# Patient Record
Sex: Male | Born: 1980 | Race: White | Hispanic: No | Marital: Married | Smoking: Never smoker
Health system: Southern US, Community
[De-identification: ages and names within clinical notes are randomized; demographics above are authoritative.]

## PROBLEM LIST (undated history)

## (undated) DIAGNOSIS — I10 Essential (primary) hypertension: Secondary | ICD-10-CM

## (undated) DIAGNOSIS — F4310 Post-traumatic stress disorder, unspecified: Secondary | ICD-10-CM

## (undated) DIAGNOSIS — E785 Hyperlipidemia, unspecified: Secondary | ICD-10-CM

---

## 2003-09-14 ENCOUNTER — Emergency Department (HOSPITAL_COMMUNITY): Admission: EM | Admit: 2003-09-14 | Discharge: 2003-09-15 | Payer: Self-pay | Admitting: Emergency Medicine

## 2003-09-17 ENCOUNTER — Emergency Department (HOSPITAL_COMMUNITY): Admission: EM | Admit: 2003-09-17 | Discharge: 2003-09-17 | Payer: Self-pay | Admitting: Emergency Medicine

## 2013-10-26 ENCOUNTER — Emergency Department (HOSPITAL_BASED_OUTPATIENT_CLINIC_OR_DEPARTMENT_OTHER)
Admission: EM | Admit: 2013-10-26 | Discharge: 2013-10-27 | Disposition: A | Discharge status: Home / Self Care | Payer: Non-veteran care | Attending: Emergency Medicine | Admitting: Emergency Medicine

## 2013-10-26 ENCOUNTER — Encounter (HOSPITAL_BASED_OUTPATIENT_CLINIC_OR_DEPARTMENT_OTHER): Payer: Self-pay | Admitting: Emergency Medicine

## 2013-10-26 DIAGNOSIS — Z79899 Other long term (current) drug therapy: Secondary | ICD-10-CM | POA: Insufficient documentation

## 2013-10-26 DIAGNOSIS — F411 Generalized anxiety disorder: Secondary | ICD-10-CM | POA: Insufficient documentation

## 2013-10-26 DIAGNOSIS — Z8639 Personal history of other endocrine, nutritional and metabolic disease: Secondary | ICD-10-CM | POA: Insufficient documentation

## 2013-10-26 DIAGNOSIS — H9209 Otalgia, unspecified ear: Secondary | ICD-10-CM | POA: Insufficient documentation

## 2013-10-26 DIAGNOSIS — T498X5A Adverse effect of other topical agents, initial encounter: Secondary | ICD-10-CM | POA: Insufficient documentation

## 2013-10-26 DIAGNOSIS — R221 Localized swelling, mass and lump, neck: Secondary | ICD-10-CM

## 2013-10-26 DIAGNOSIS — H5710 Ocular pain, unspecified eye: Secondary | ICD-10-CM | POA: Insufficient documentation

## 2013-10-26 DIAGNOSIS — R209 Unspecified disturbances of skin sensation: Secondary | ICD-10-CM | POA: Insufficient documentation

## 2013-10-26 DIAGNOSIS — R220 Localized swelling, mass and lump, head: Secondary | ICD-10-CM | POA: Insufficient documentation

## 2013-10-26 DIAGNOSIS — T50905A Adverse effect of unspecified drugs, medicaments and biological substances, initial encounter: Secondary | ICD-10-CM

## 2013-10-26 DIAGNOSIS — I10 Essential (primary) hypertension: Secondary | ICD-10-CM | POA: Insufficient documentation

## 2013-10-26 DIAGNOSIS — Z862 Personal history of diseases of the blood and blood-forming organs and certain disorders involving the immune mechanism: Secondary | ICD-10-CM | POA: Insufficient documentation

## 2013-10-26 DIAGNOSIS — R61 Generalized hyperhidrosis: Secondary | ICD-10-CM | POA: Insufficient documentation

## 2013-10-26 DIAGNOSIS — H538 Other visual disturbances: Secondary | ICD-10-CM | POA: Insufficient documentation

## 2013-10-26 HISTORY — DX: Essential (primary) hypertension: I10

## 2013-10-26 HISTORY — DX: Hyperlipidemia, unspecified: E78.5

## 2013-10-26 HISTORY — DX: Post-traumatic stress disorder, unspecified: F43.10

## 2013-10-26 NOTE — ED Notes (Signed)
Pt reports headache onset this AM felt like lightening strike thjat has progressively worsened today. Currently report slight blurring of vision, numbness bilat hands and right side of face

## 2013-10-26 NOTE — ED Provider Notes (Signed)
CSN: [id number]     Arrival date & time 10/26/13  2320 History  This chart was scribed for Henry Davis Smitty CordsK Joclyn Alsobrook-Rasch, MD by Phillis HaggisGabriella Gaje, ED Scribe. This patient was seen in room MH12/MH12 and patient care was started at 12:15 AM.    Chief Complaint  Patient presents with  . Headache   Patient is a 33 y.o. male presenting with headaches. The history is provided by the patient. No language interpreter was used.  Headache Location: pinna of B ears are burning. Quality: burning. Radiates to:  Does not radiate Severity currently:  4/10 Severity at highest:  4/10 Onset quality:  Gradual Duration:  1 day Timing:  Constant Progression:  Worsening Context: not coughing, not intercourse and not straining   Relieved by:  Nothing Worsened by:  Nothing tried Ineffective treatments:  Acetaminophen Associated symptoms: ear pain, eye pain and paresthesias   Associated symptoms: no abdominal pain, no congestion, no cough, no diarrhea, no dizziness, no fatigue, no fever, no focal weakness, no hearing loss, no myalgias, no nausea, no near-syncope, no neck pain, no neck stiffness, no numbness, no photophobia, no seizures, no sinus pressure, no sore throat, no swollen glands, no syncope and no vomiting   Associated symptoms comment:  Eye redness per report Ear pain:    Location:  Bilateral   Severity:  Moderate   Onset quality:  Gradual   Timing:  Constant   Progression:  Unchanged   Chronicity:  New Risk factors: no family hx of SAH   HPI Comments: Henry Davis is a 33 y.o. male with a history of HTN who presents to the Emergency Department complaining burning of B ears and facial and B hand numbness x 24 hours. He reports associated tingling on the right side of his face with changes in taste, noting that food tastes bland on his entire tongue with lip swelling. He reports burning bilateral ear pain. He also reports eye pain with blurriness but no double vision also states eyes are burning and red. He  reports bilateral hand and right face numbness and irregular sweating. He reports taking tylenol and BP pill. He denies new travel, cough, congestion, nausea, vomiting, diarrhea, constipation, abdominal pain, sore throat, or joint pain, no neck pain or stiffness. No tick or insect exposure. He states that he does not have a PCP but [facility] [place]. 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 He reports being put on a new anti-depressant, Venlafaxine 75 mg, but denies consistently taking his medication.  Past Medical History  Diagnosis Date  . Hypertension   . PTSD (post-traumatic stress disorder)   . Hyperlipidemia    History reviewed. No pertinent past surgical history. History reviewed. No pertinent family history. History  Substance Use Topics  . Smoking status: Never Smoker   . Smokeless tobacco: Never Used  . Alcohol Use: Yes    Review of Systems  Constitutional: Positive for diaphoresis. Negative for fever, chills and fatigue.  HENT: Positive for ear pain. Negative for congestion, dental problem, drooling, ear discharge, facial swelling, hearing loss, nosebleeds, sinus pressure, sore throat, trouble swallowing and voice change.   Eyes: Positive for pain and visual disturbance. Negative for photophobia.  Respiratory: Negative for cough and wheezing.   Cardiovascular: Negative for chest pain, palpitations, leg swelling, syncope and near-syncope.  Gastrointestinal: Negative for nausea, vomiting, abdominal pain and diarrhea.  Genitourinary: Negative for dysuria.  Musculoskeletal: Negative for arthralgias, myalgias, neck pain and neck stiffness.  Skin: Negative for color change and rash.  Neurological: Positive for headaches and paresthesias. Negative for dizziness, focal weakness, seizures, facial asymmetry, weakness, light-headedness and numbness.  All other systems reviewed and are negative.  Allergies  Review of patient's allergies indicates no  known allergies.  Home Medications   Prior to Admission medications   Medication Sig Start Date End Date Taking? Authorizing Provider  clonazePAM (KLONOPIN) 0.5 MG tablet Take 0.5 mg by mouth 3 (three) times daily as needed for anxiety.   Yes Historical Provider, MD  traZODone (DESYREL) 50 MG tablet Take 50 mg by mouth at bedtime.   Yes Historical Provider, MD   BP 117/91  Pulse 88  Temp(Src) 98.1 F (36.7 C) (Oral)  Resp 18  SpO2 97% Physical Exam  Constitutional: He is oriented to person, place, and time. He appears well-developed and well-nourished. No distress.  Sitting comfortably in the room with lights on  HENT:  Head: Normocephalic and atraumatic.  Left Ear: External ear normal.  Mouth/Throat: Oropharynx is clear and moist. No oropharyngeal exudate.  Redness in right canal, but TM is normal. Trachea is midline.   Eyes: Conjunctivae and EOM are normal. Pupils are equal, round, and reactive to light.  Neck: Normal range of motion. Neck supple. No thyromegaly present.  No meningismus.  Cardiovascular: Normal rate, regular rhythm and intact distal pulses.   Pulmonary/Chest: Effort normal and breath sounds normal. No respiratory distress. He has no wheezes. He has no rales. He exhibits no tenderness.  Abdominal: Soft. Bowel sounds are normal. There is no tenderness. There is no rebound and no guarding.  Musculoskeletal: Normal range of motion. He exhibits no edema and no tenderness.  Lymphadenopathy:    He has no cervical adenopathy.  Neurological: He is alert and oriented to person, place, and time. He has normal reflexes. He displays no atrophy, no tremor and normal reflexes. No cranial nerve deficit. He exhibits normal muscle tone. He displays no seizure activity. GCS eye subscore is 4. GCS verbal subscore is 5. GCS motor subscore is 6.  No rigity no clonus no hyperreflexia  Skin: Skin is warm and dry. No rash noted. No erythema. No pallor.  Psychiatric: Thought content  normal. His mood appears anxious.    ED Course  Procedures (including critical care time) DIAGNOSTIC STUDIES: Oxygen Saturation is 97% on room air, normal by my interpretation.    COORDINATION OF CARE: 12:22 AM-Discussed treatment plan which includes labs with pt at bedside and pt agreed to plan.   Labs Review Labs Reviewed  CBC WITH DIFFERENTIAL - Abnormal; Notable for the following:    WBC 11.1 (*)    All other components within normal limits  COMPREHENSIVE METABOLIC PANEL  TROPONIN I   Imaging Review Ct Head Wo Contrast  10/27/2013   CLINICAL DATA:  Headache  EXAM: CT HEAD WITHOUT CONTRAST  TECHNIQUE: Contiguous axial images were obtained from the base of the skull through the vertex without intravenous contrast.  COMPARISON:  None.  FINDINGS: There is no acute intracranial hemorrhage or infarct. No mass lesion or midline shift. Gray-white matter differentiation is well maintained. Ventricles are normal in size without evidence of hydrocephalus. CSF containing spaces are within normal limits. No extra-axial fluid collection.  The calvarium is intact.  Orbital soft tissues are within normal limits.  The paranasal sinuses and mastoid air cells are well pneumatized and free of fluid.  Scalp soft tissues are unremarkable.  IMPRESSION: Normal head CT with no acute intracranial process identified.  Electronically Signed   By: Rise Mu M.D.   On: 10/27/2013 00:26    EKG Interpretation None      MDM   Final diagnoses:  None  This was not a sudden onset maximal instensity HA.  In fact the patient did not even mention HA to the EDP mentions tingling and ears burning which started and have gotten progressively worse.  CT scan excludes SAH which was highly doubted to begin with.  No meningitis symptoms.  No travel or tick exposure.     Case d/w Dr. Thad Ranger of neurology who states symptoms do not sound like any neurologic condition nor meningitis and may be related to PTSD.  No  indication for further work up.  Recommends follow up with patient's primary doctor  Suspect anxiety component to symptoms but upon review the Effexor which was recently started lists all the patient's symptoms as a side effect.  Recommend discussing this medication with your doctor.  Symptoms improved in the ED.  Return for focal weakness changes in speech headache, fever rashes stiff neck or any concerns.    I personally performed the services described in this documentation, which was scribed in my presence. The recorded information has been reviewed and is accurate.      Jasmine Awe, MD 10/27/13 [phone]

## 2013-10-27 ENCOUNTER — Encounter (HOSPITAL_BASED_OUTPATIENT_CLINIC_OR_DEPARTMENT_OTHER): Payer: Self-pay | Admitting: Emergency Medicine

## 2013-10-27 ENCOUNTER — Emergency Department (HOSPITAL_BASED_OUTPATIENT_CLINIC_OR_DEPARTMENT_OTHER): Payer: Non-veteran care

## 2013-10-27 LAB — URINE MICROSCOPIC-ADD ON

## 2013-10-27 LAB — COMPREHENSIVE METABOLIC PANEL
ALT: 86 U/L — AB (ref 0–53)
AST: 37 U/L (ref 0–37)
Albumin: 4.1 g/dL (ref 3.5–5.2)
Alkaline Phosphatase: 63 U/L (ref 39–117)
Anion gap: 14 (ref 5–15)
BUN: 13 mg/dL (ref 6–23)
CALCIUM: 9.8 mg/dL (ref 8.4–10.5)
CO2: 22 mEq/L (ref 19–32)
Chloride: 106 mEq/L (ref 96–112)
Creatinine, Ser: 1 mg/dL (ref 0.50–1.35)
GFR calc Af Amer: >90 mL/min (ref >90)
GFR calc non Af Amer: >90 mL/min (ref >90)
Glucose, Bld: 143 mg/dL — ABNORMAL HIGH (ref 70–99)
POTASSIUM: 4.3 meq/L (ref 3.7–5.3)
Sodium: 142 mEq/L (ref 137–147)
TOTAL PROTEIN: 7.3 g/dL (ref 6.0–8.3)
Total Bilirubin: 0.2 mg/dL — ABNORMAL LOW (ref 0.3–1.2)

## 2013-10-27 LAB — CBC WITH DIFFERENTIAL/PLATELET
BASOS ABS: 0.1 10*3/uL (ref 0.0–0.1)
Basophils Relative: 1 % (ref 0–1)
EOS PCT: 3 % (ref 0–5)
Eosinophils Absolute: 0.3 10*3/uL (ref 0.0–0.7)
HCT: 45.6 % (ref 39.0–52.0)
Hemoglobin: 15.7 g/dL (ref 13.0–17.0)
Lymphocytes Relative: 33 % (ref 12–46)
Lymphs Abs: 3.6 10*3/uL (ref 0.7–4.0)
MCH: 30.7 pg (ref 26.0–34.0)
MCHC: 34.4 g/dL (ref 30.0–36.0)
MCV: 89.2 fL (ref 78.0–100.0)
Monocytes Absolute: 0.8 10*3/uL (ref 0.1–1.0)
Monocytes Relative: 7 % (ref 3–12)
Neutro Abs: 6.3 10*3/uL (ref 1.7–7.7)
Neutrophils Relative %: 57 % (ref 43–77)
Platelets: 248 10*3/uL (ref 150–400)
RBC: 5.11 MIL/uL (ref 4.22–5.81)
RDW: 13.3 % (ref 11.5–15.5)
WBC: 11.1 10*3/uL — ABNORMAL HIGH (ref 4.0–10.5)

## 2013-10-27 LAB — RAPID URINE DRUG SCREEN, HOSP PERFORMED
Amphetamines: NOT DETECTED
Barbiturates: NOT DETECTED
Benzodiazepines: NOT DETECTED
COCAINE: NOT DETECTED
OPIATES: NOT DETECTED
TETRAHYDROCANNABINOL: NOT DETECTED

## 2013-10-27 LAB — URINALYSIS, ROUTINE W REFLEX MICROSCOPIC
BILIRUBIN URINE: NEGATIVE
Glucose, UA: NEGATIVE mg/dL
HGB URINE DIPSTICK: NEGATIVE
Ketones, ur: NEGATIVE mg/dL
Leukocytes, UA: NEGATIVE
Nitrite: NEGATIVE
PROTEIN: NEGATIVE mg/dL
Specific Gravity, Urine: 1.021 (ref 1.005–1.030)
UROBILINOGEN UA: 1 mg/dL (ref 0.0–1.0)
pH: 8 (ref 5.0–8.0)

## 2013-10-27 LAB — TROPONIN I: Troponin I: <0.3 ng/mL (ref <0.30)

## 2013-10-27 MED ORDER — IBUPROFEN 800 MG PO TABS: 800.0000 mg | ORAL_TABLET | Freq: Three times a day (TID) | ORAL | Status: AC

## 2013-10-27 MED ORDER — KETOROLAC TROMETHAMINE 30 MG/ML IJ SOLN
30.0000 mg | Freq: Once | INTRAMUSCULAR | Status: AC
  Administered 2013-10-27: 30 mg via INTRAVENOUS
  Filled 2013-10-27: qty 1

## 2013-10-27 NOTE — ED Notes (Signed)
Pt ambulating independently w/ steady gait on d/c in no acute distress, A&Ox4. D/c instructions reviewed w/ pt - pt denies any further questions or concerns at present. Rx given x1  

## 2013-10-27 NOTE — ED Notes (Signed)
Patient stated he felt like he was burning up hot. I gave patient a fan. Comfort improved.

## 2013-10-27 NOTE — ED Notes (Signed)
Pt reports posterior HA that began today, diaphoresis and intermittent facial numbness - pt in no acute distress at present - pleasant and cooperative. No focal weakness noted on assessment.

## 2013-10-27 NOTE — Discharge Instructions (Signed)
°Emergency Department Resource Guide °1) Find a Doctor and Pay Out of Pocket °Although you won't have to find out who is covered by your insurance plan, it is a good idea to ask around and get recommendations. You will then need to call the office and see if the doctor you have chosen will accept you as a new patient and what types of options they offer for patients who are self-pay. Some doctors offer discounts or will set up payment plans for their patients who do not have insurance, but you will need to ask so you aren't surprised when you get to your appointment. ° °2) Contact Your Local Health Department °Not all health departments have doctors that can see patients for sick visits, but many do, so it is worth a call to see if yours does. If you don't know where your local health department is, you can check in your phone book. The CDC also has a tool to help you locate your state's health department, and many state websites also have listings of all of their local health departments. ° °3) Find a Walk-in Clinic °If your illness is not likely to be very severe or complicated, you may want to try a walk in clinic. These are popping up all over the country in pharmacies, drugstores, and shopping centers. They're usually staffed by nurse practitioners or physician assistants that have been trained to treat common illnesses and complaints. They're usually fairly quick and inexpensive. However, if you have serious medical issues or chronic medical problems, these are probably not your best option. ° °No Primary Care Doctor: °- Call [facility] at  832-8000 - they can help you locate a primary care doctor that  accepts your insurance, provides certain services, etc. °- Physician Referral Service- 1-800-533-3463 ° °Chronic Pain Problems: °Organization         Address  Phone   Notes  °[facility]  (336) 297-2271 Patients need to be referred by their primary care doctor.  ° °Medication  Assistance: °Organization         Address  Phone   Notes  °[place] [facility] [address] °[place], [place] [address] (336) 641-8030 --Must be a resident of [place] °-- Must have NO insurance coverage whatsoever (no Medicaid/ Medicare, etc.) °-- The pt. MUST have a primary care doctor that directs their care regularly and follows them in the community °  °[facility]  (866) 331-1348   °[employer]  (888) 892-1162   ° °Agencies that provide inexpensive medical care: °Organization         Address  Phone   Notes  °[facility]  (336) 832-8035   °[facility]    (336) 832-7272   °[facility] [address] °[place], [place] [address] (336) 832-4777   °[facility] [address], °[place] (336) 271-4999   °[facility]    (336) 373-0678   °[facility]    (336) 272-1050   °[facility] ° [address], [place] Phone:  (336) 832-4444, Fax:  (336) 832-4440 Hours of Operation:  9 am - 6 pm, M-F.  Also accepts Medicaid/Medicare and self-pay.  °[facility] ° [address], [place] Phone: (336) 832-3150, Fax: (336) 832-3151. Hours of Operation:  8:30 am - 5:30 pm, M-F.  Also accepts Medicaid and self-pay.  °[facility] [place] [address], [place] Phone: (336) 878-6027   °[facility] [address], [place], [place] (336)723-1848, Ext. 123 Mondays & Thursdays: 7-9 AM.  First 15 patients are seen on a first come, first serve basis. °  ° °Medicaid-accepting [place] Providers: ° °Organization         Address  Phone   Notes  °[facility] [address], [place] (336) 641-2100 Also accepts self-pay patients.  °[facility] [address], [place] ° (336) 856-9996   °[facility] [address] [place] [address], [place]  (336) 288-8857   °[facility] [address] [place] [address], [place] (336) 299-7000   °Veita Bland [address], [place]  ° (336) 373-1557 Only accepts [place] Access Medicaid patients after they have their name applied to their card.  ° °Self-Pay (no insurance) in [place]: ° °Organization         Address  Phone   Notes  °Sickle Cell Patients, [facility] [address], [place] (336) 832-1970   °[facility] [address], [place] (336) 832-4400   °[facility] ° [address], [place] (336) 992-4800   °[facility]/Dr. Osei-Bonsu ° [address] [place] [address], [place] or [address], [place] (336) [phone] Phone number for both [place] and [place] locations is the same.  °[facility] [address], [place] (336) 299-0000   °[facility] [address] [place] [address], [place] or [address] (336) 852-7530 °(336) 878-2260   °[facility] [address], [place] (336) 350-1642, phone; (336) 294-5005, fax Sees patients 1st and 3rd Saturday of every month.  Must not qualify for public or private insurance (i.e. Medicaid, Medicare, [place] Health Choice, Veterans' Benefits) • Household income should be no more than 200% of the poverty level •The clinic cannot treat you if you are pregnant or think you are pregnant • Sexually transmitted diseases are not treated at the clinic.  ° ° °Dental Care: °Organization         Address  Phone  Notes  °[place] [facility] [address], [place] (336) 641-6152 Accepts children up to age 21 who are enrolled in Medicaid or [place] Health Choice; pregnant women with a Medicaid card; and children who have applied for Medicaid or [place] Health Choice, but were declined, whose parents can pay a reduced fee at time of service.  °[place]  [facility] [place]  [address], [place] (336) 641-7733 Accepts children up to age 21 who are enrolled in Medicaid or [place] Health Choice; pregnant women with a Medicaid card; and children who have applied for Medicaid or [place] Health Choice, but were declined, whose parents can pay a reduced fee at time of service.  °[facility] ° [address],  (336) 641-4533 Patients are seen by appointment only. Walk-ins are not accepted. [facility] will see patients 18 years of age and older. °Monday - Tuesday (8am-5pm) °Most Wednesdays (8:30-5pm) °$30 per visit, cash only  °[facility] ° [address], [place] (336) 641-4533 Patients are seen by appointment only. Walk-ins are not accepted. [facility] will see patients 18 years of age and older. °One   Wednesday Evening (Monthly: Volunteer Based).  $30 per visit, cash only  °[facility]  (919) 537-3737 for adults; Children under age 4, call Graduate Pediatric Dentistry at ([phone]) [phone]. Children aged 4-14, please call ([phone]) [phone] to request a pediatric application. ° Dental services are provided in all areas of dental care including fillings, crowns and bridges, complete and partial dentures, implants, gum treatment, root canals, and extractions. Preventive care is also provided. Treatment is provided to both adults and children. °Patients are selected via a lottery and there is often a waiting list. °  °[facility] [address], °[place] ° (336) 763-8833 [email or web address] °  °[facility] [address], [place], [place] (336)723-1848, Ext. 123 Second and Fourth Thursday of each month, opens at 6:30 AM; Clinic ends at 9 AM.  Patients are seen on a first-come first-served basis, and a limited number are seen during each clinic.  ° °[facility] ° [address], [place], [place] (336) 723-7904    Eligibility Requirements °You must have lived in [place], or [place] counties for at least the last three months. °  You cannot be eligible for state or federal sponsored healthcare insurance, including Veterans Administration, Medicaid, or Medicare. °  You generally cannot be eligible for healthcare insurance through your employer.  °  How to apply: °Eligibility screenings are held every Tuesday and Wednesday afternoon from 1:00 pm until 4:00 pm. You do not need an appointment for the interview!  °[facility] [address], [place], [place] 336-631-2330   °[facility]  336-342-8273   °[facility]  336-703-3100   °[facility]  336-570-6415   ° °Behavioral Health Resources in the Community: °Intensive Outpatient Programs °Organization         Address  Phone  Notes  °[facility] [address], [place], [place] 336-878-6098   °[facility] [address], [place], [place] 336-832-9800   °[facility] [address], [place], [place] ° 336-882-2125   °[facility] [address],  °[place], [place] 1-800-853-5163 or 336-641-4981   °Substance Abuse Resources °Organization         Address  Phone  Notes  °[facility]  336-882-2125   °[facility]  336-784-9470   °[facility]  336-285-9073   °[facility]  336-845-3988   °Residential & Outpatient Substance Abuse Program  1-800-659-3381   °Psychological Services °Organization         Address  Phone  Notes  °[facility]  336- 832-9600   °[facility]  336- 378-7881   °[facility] [address], [place] 1-800-853-5163 or 336-641-4981   ° °Mobile Crisis Teams °Organization         Address  Phone  Notes  °[facility], Mobile Crisis Care Unit  1-877-626-1772   °Assertive °[facility] ° [address]  [place], [place] 336-834-9664   °Sharon DeEsch [address] °[place] 336-554-5454   ° °Self-Help/Support Groups °Organization         Address  Phone             Notes  °[facility] [place] [facility] [facility] - variety of support groups  336- 373-1402 Call for more information  °Narcotics Anonymous (NA), [facility] [address], °[place]  2 meetings at this location  ° °  Residential Treatment Programs °Organization         Address  Phone  Notes  °[facility] [address],    °[place]  1-866-801-8205   °[facility] ° [address], [place], [place] 704-293-8524   °[facility] [address], [place] 336-845-3988 Admissions: 8am-3pm M-F  °[facility] [address]    °[place], [place] 336-841-1104   °[facility] [address], [place], [place] 336-379-7146   °[facility] [address]  °[place], [place] 336-285-9073   °[facility] - Intensive Outpatient [address], [place], [place] 336-852-3033   °[facility] [address]  °[place], [place] 1-877-615-2722 or 336-784-9470   °[facility] [address] [place], [place] 336-227-7417 Accepts Medicaid  °[facility] [address]  °[place] 1-800-659-3381 Substance Abuse/Addiction Treatment  ° °[place] Behavioral Health Resources °Organization         Address  Phone  Notes  °[facility]  (888) 581-9988   °Julie Brannon, PhD [address] [place], [place]   (336) 349-5553 or (336) 951-0000   °[facility]   [address] °[place], [place] (336) 349-4454   °[facility] [address], [place], [place] (336) 342-8316 Insurance/Medicaid/sponsorship through [facility]  °[facility] [address]                                    [place], [place] (336) 342-8316 Therapy/tele-psych/case    °[facility] [address]  ° [place], [place] (336) 349-2233    °Dr. Arfeen  (336) 349-4544   °[facility] [place]  [employer] [place] [facility] 1) [address], [place] °2) [address], [place] °3)  [address], [place] (336) 349-3220 °(336) 342-7768 ° °(336) 342-8140   °[place] [facility] (336) 342-1394 or (336) 342-3537 (After Hours)    ° ° °

## 2016-02-01 IMAGING — CT CT HEAD W/O CM
2 series · 16 of 30 positions shown, 18 images · non-contrast
Comparison: None.

CLINICAL DATA: Headache

EXAM:
CT HEAD WITHOUT CONTRAST
TECHNIQUE: Contiguous axial images were obtained from the base of the skull
through the vertex without intravenous contrast.

[Series 2: head 4.8 h37s · axial · 0.50mm/px · z∈[-121,+15]mm · 8 of 36 slices shown, 10 images]
[im 4/36  brain]
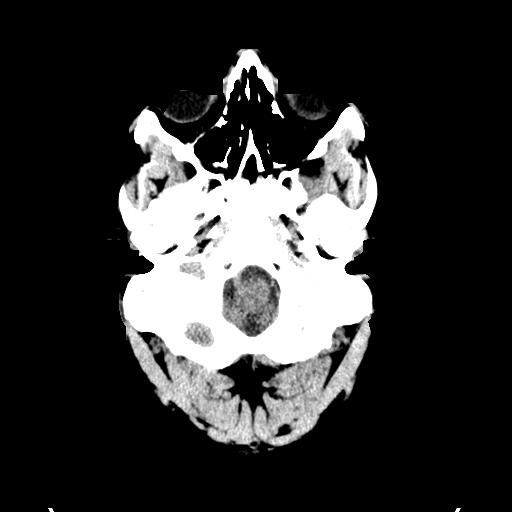
[im 4/36  bone]
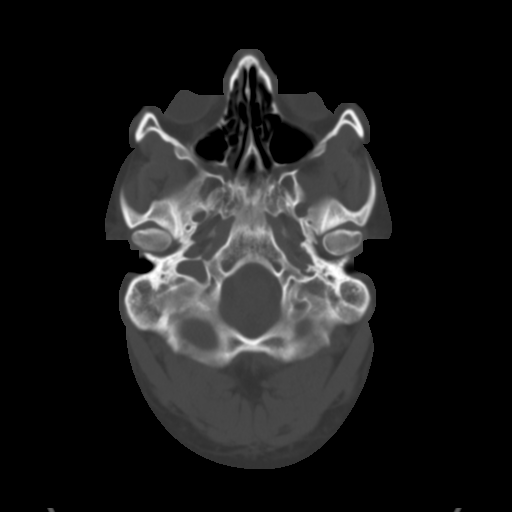
[im 8/36  brain]
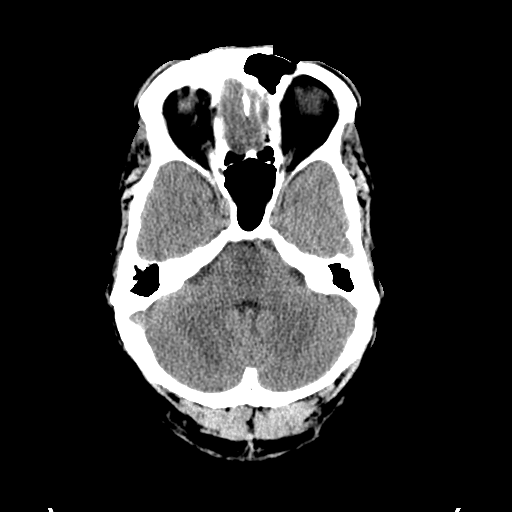
[im 12/36  brain]
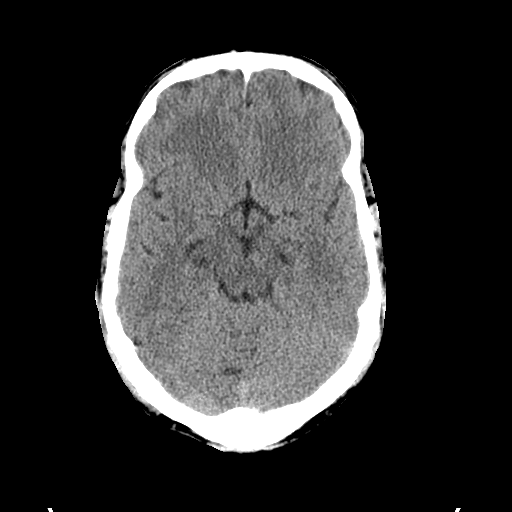
[im 16/36  brain]
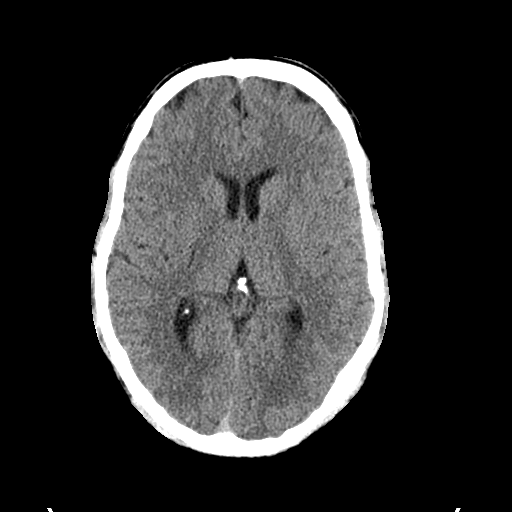
[im 20/36  brain]
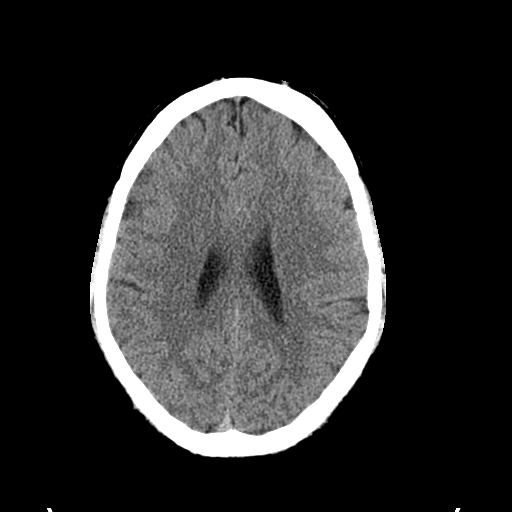
[im 20/36  bone]
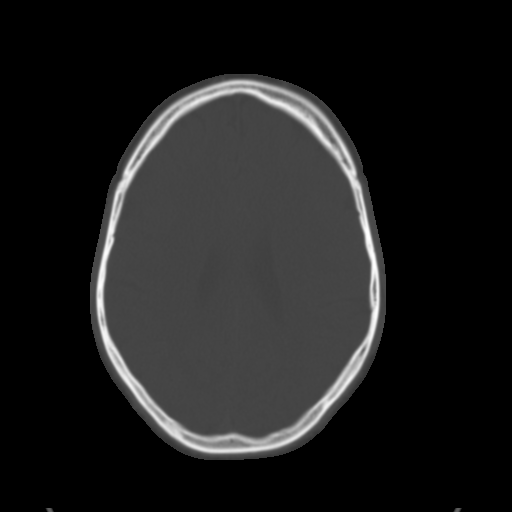
[im 24/36  brain]
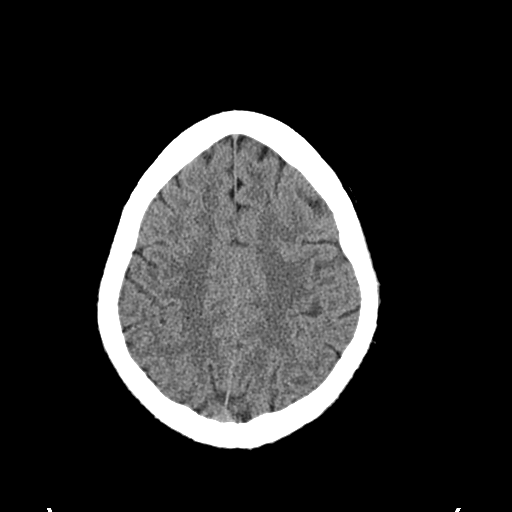
[im 28/36  brain]
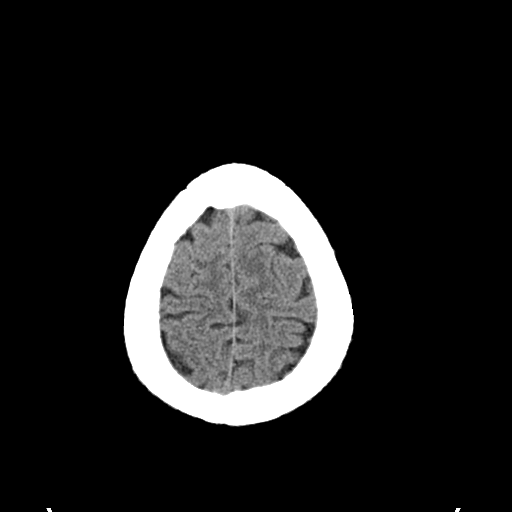
[im 32/36  brain]
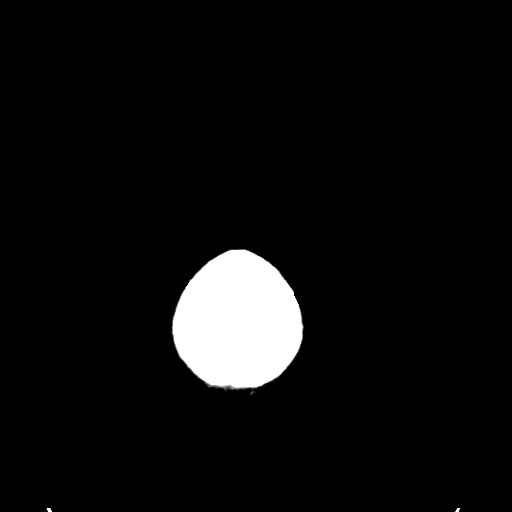

[Series 3: head 2.4 h60s bone · axial · 0.50mm/px · z∈[-120,+16]mm · 8 of 72 slices shown]
[im 8/72  bone]
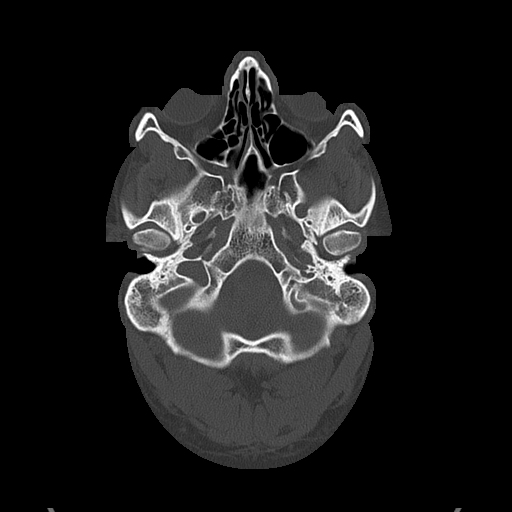
[im 15/72  bone]
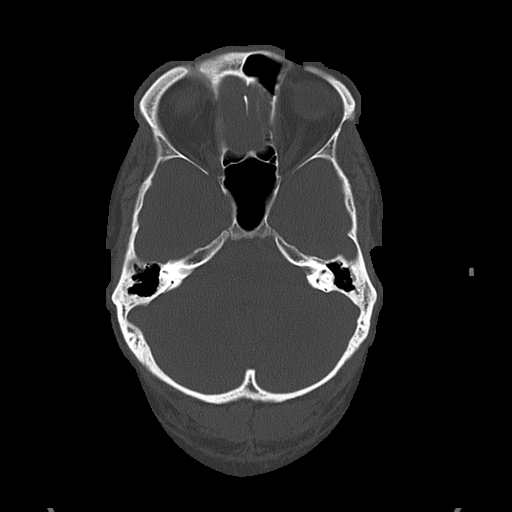
[im 23/72  bone]
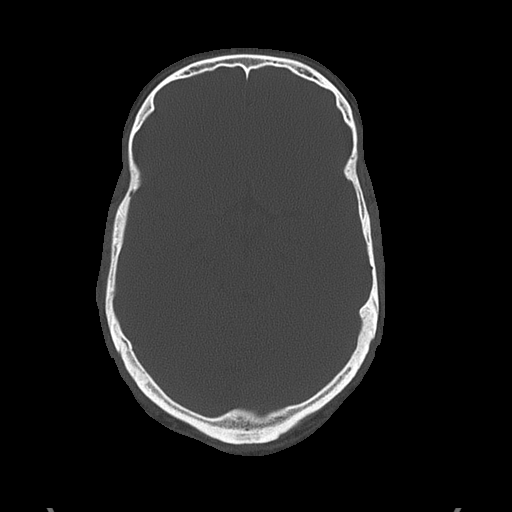
[im 30/72  bone]
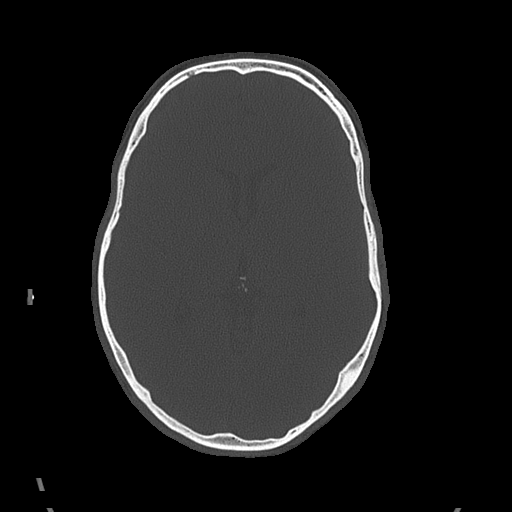
[im 42/72  bone]
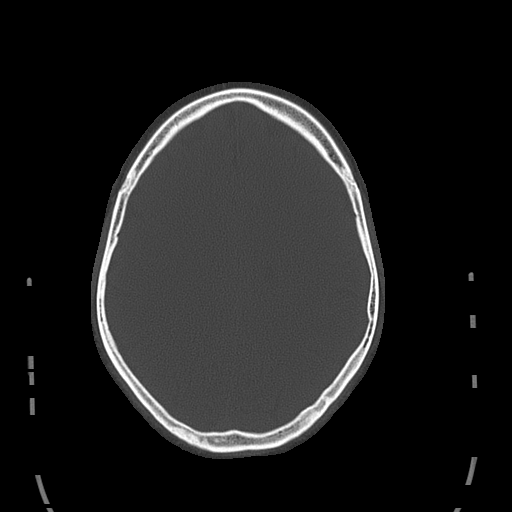
[im 49/72  bone]
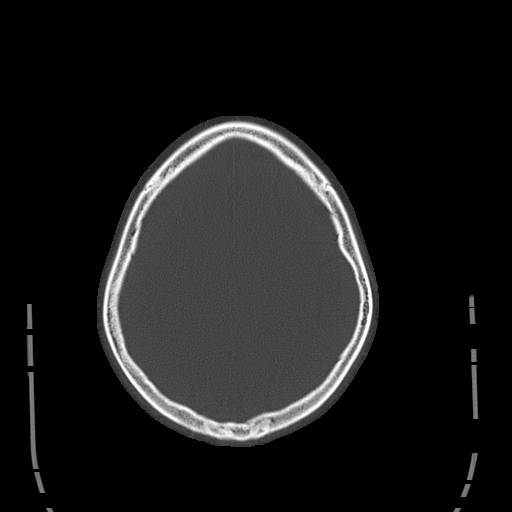
[im 57/72  bone]
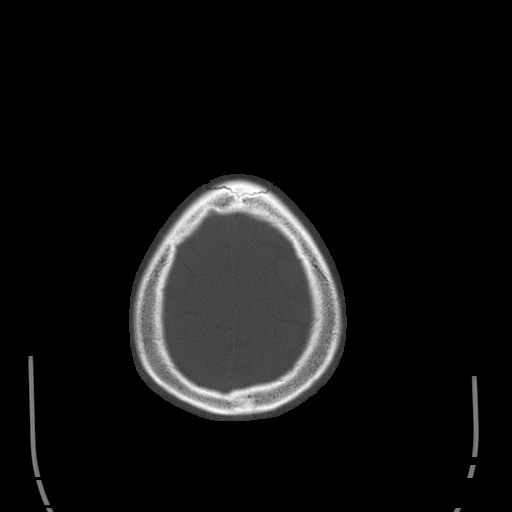
[im 64/72  bone]
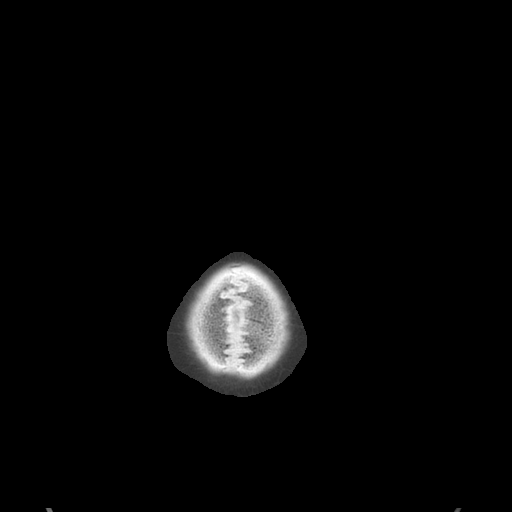

[16 of 30 positions shown; findings below may reference images not displayed]

FINDINGS: There is no acute intracranial hemorrhage or infarct. No mass lesion
or midline shift. Gray-white matter differentiation is well
maintained. Ventricles are normal in size without evidence of
hydrocephalus. CSF containing spaces are within normal limits. No
extra-axial fluid collection.

The calvarium is intact.

Orbital soft tissues are within normal limits.

The paranasal sinuses and mastoid air cells are well pneumatized and
free of fluid.

Scalp soft tissues are unremarkable.
IMPRESSION: Normal head CT with no acute intracranial process identified.

## 2018-07-19 ENCOUNTER — Ambulatory Visit: Payer: Self-pay | Admitting: Orthopedic Surgery

## 2018-07-19 NOTE — H&P (Signed)
Subjective:   Patient is a 38 y.o. 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  Onset of symptoms was approximately 6 months ago with progressive course since that time that has failed to improve with conservative measures including injection therapy and narcotic medications. He is being admitted for surgical management of this condition. The indications for the procedure include progressive neurological pain, loss in function, and change to activities of daily living  .   Past Medical History:  Diagnosis Date  . Hyperlipidemia   . Hypertension   . PTSD (post-traumatic stress disorder)    - Diabetes mellitus   No past surgical history on file.  Current Outpatient Medications  Medication Sig Dispense Refill Last Dose  . clonazePAM (KLONOPIN) 0.5 MG tablet Take 0.5 mg by mouth 3 (three) times daily as needed for anxiety.     Marland Kitchen ibuprofen (ADVIL,MOTRIN) 800 MG tablet Take 1 tablet (800 mg total) by mouth 3 (three) times daily. 21 tablet 0   . traZODone (DESYREL) 50 MG tablet Take 50 mg by mouth at bedtime.      No current facility-administered medications for this visit.    No Known Allergies  Social History   Tobacco Use  . Smoking status: Never Smoker  . Smokeless tobacco: Never Used  Substance Use Topics  . Alcohol use: Yes    Family History reviewed on 07/18/2018:  Father  - Heart disease     - Hypertensive disorder Mother - Diabetes mellitus   Review of Systems (copied from 07/18/2018 encounter) Constitutional: Constitutional: no fever, chills, night sweats, or significant weight loss. Cardiovascular: Cardiovascular: no palpitations or chest pain. Respiratory: Respiratory: no cough or shortness of breath and No COPD. Gastrointestinal: Gastrointestinal: no vomiting or nausea. Musculoskeletal: Musculoskeletal: no joint pain or swelling in Joints. Neurologic: Neurologic: no numbness, tingling, or difficulty with balance.  ROS otherwise as noted  in the HPI  Objective:   Physical exam copied from 07/10/2018 Encounter; Most recent Encounter was via telemed 07/18/2018 and therefore no exam was done then.  Vitals:  Ht: 5 ft 11 in  Wt: 269 lbs  BMI: 37.5  Pain Scale: 5  General: AAOX3, well developed and well nourished, NAD Ambulation: normal gait pattern, uses no assistive device. Inspection: No obvious deformity, scoleosis, kyphosis, loss of lordotic curve. Cardiovascular: Regular rate and rhythm, no rubs murmurs or gallops Lungs: Clear to auscultation bilaterally Abdomen: Bowel sounds 4 nontender, nondistended, no hepatosplenomegaly  Palpation: Non-tender over spinous processes and paraspinal muscles.  AROM: - Knee: flexion and extension normal and pain free bilaterally. - Ankle: Dorsiflexion, plantarflexion, inversion, eversion normal and pain free.  Dermatomes: Lower extremity sensation to light touch abnormal  Myotomes: - Hip Flexion: Left 5/5, Right 5/5 -Hip Adduction: Left 5/5, Right 5/5 - Knee Extension: Left 5/5, Right 5/5 - Knee Flexion: Left 5/5, Right 5/5 - Ankle Dorsiflextion: Left 5/5, Right 5/5 - Ankle Eversion: Left 5/5, Right 5/5 - Ankle Plantarflexion: Left 5/5, Right 5/5  Reflexes: - Patella: Left2+, Right 2+ - Achilles: Left2+, Right 2+ - Babinski: Left Ngative, Right Negative - Clonus: Negative  Special Tests: - Straight Leg Raise: Left Negative, Right Positive  PV: Extremities warm and well profused. Posterior and dorsalis pedis pulse 2+ bilaterally, No pitting Edema, discoloration, calf tenderness, or palpable cords. Homan's negative bilaterally.  MRI Impression: Right central disc extrusion L4-5 with moderate compression of the thecal sac. This is associated with moderate spinal stenosis. Mild disc bulge at L5-S1  Assessment:  1. 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 Lumbar  radiculopathy M54.16: Radiculopathy, lumbar region  Pain and quality of life has failed to improve with narcotic medications and injection therapy.   At this point time I do believe the patient's neurological deficit and progressive pain despite appropriate conservative management warrant surgical intervention. I would consider this a nonelective procedure.  Plan:   At this point time because of the progressive neurological pain, loss in function, and change to activities of daily living he would like to move forward with surgical intervention. I have reviewed the surgical procedure which would be bilateral hemilaminotomy and decompression/discectomy with the patient and his wife. All of their questions were encouraged and addressed.  We will obtain primary care clearance for surgery. We will then move forward with lumbar decompression L4-5 bilaterally. Patient will return to the office to be fitted for an LSO brace prior to surgery.  Risks and benefits of surgery were discussed with the patient. These include: Infection, bleeding, death, stroke, paralysis, ongoing or worse pain, need for additional surgery, leak of spinal fluid, adjacent segment degeneration requiring additional surgery, post-operative hematoma formation that can result in neurological compromise and the need for urgent/emergent re-operation. Loss in bowel and bladder control. Injury to major vessels that could result in the need for urgent abdominal surgery to stop bleeding. Risk of deep venous thrombosis (DVT) and the need for additional treatment. Recurrent disc herniation resulting in the need for revision surgery, which could include fusion surgery (utilizing instrumentation such as pedicle screws and intervertebral cages).

## 2018-07-26 ENCOUNTER — Ambulatory Visit (HOSPITAL_COMMUNITY): Admission: RE | Admit: 2018-07-26 | Payer: Non-veteran care | Source: Home / Self Care | Admitting: Orthopedic Surgery

## 2018-07-26 ENCOUNTER — Encounter (HOSPITAL_COMMUNITY): Admission: RE | Payer: Self-pay | Source: Home / Self Care

## 2018-07-26 SURGERY — LUMBAR LAMINECTOMY/DECOMPRESSION MICRODISCECTOMY 1 LEVEL
Anesthesia: General
